# Patient Record
Sex: Female | Born: 1975 | Race: Black or African American | Hispanic: No | Marital: Single | Smoking: Never smoker
Health system: Southern US, Community
[De-identification: ages and names within clinical notes are randomized; demographics above are authoritative.]

## PROBLEM LIST (undated history)

## (undated) DIAGNOSIS — E7800 Pure hypercholesterolemia, unspecified: Secondary | ICD-10-CM

## (undated) DIAGNOSIS — I10 Essential (primary) hypertension: Secondary | ICD-10-CM

## (undated) DIAGNOSIS — E119 Type 2 diabetes mellitus without complications: Secondary | ICD-10-CM

## (undated) DIAGNOSIS — G629 Polyneuropathy, unspecified: Secondary | ICD-10-CM

## (undated) HISTORY — PX: CHOLECYSTECTOMY: SHX55

## (undated) HISTORY — PX: ABDOMINAL HYSTERECTOMY: SHX81

---

## 2014-11-21 ENCOUNTER — Emergency Department (HOSPITAL_BASED_OUTPATIENT_CLINIC_OR_DEPARTMENT_OTHER)
Admission: EM | Admit: 2014-11-21 | Discharge: 2014-11-21 | Disposition: A | Discharge status: Home / Self Care | Payer: Medicaid Other | Attending: Emergency Medicine | Admitting: Emergency Medicine

## 2014-11-21 ENCOUNTER — Encounter (HOSPITAL_BASED_OUTPATIENT_CLINIC_OR_DEPARTMENT_OTHER): Payer: Self-pay

## 2014-11-21 DIAGNOSIS — M436 Torticollis: Secondary | ICD-10-CM | POA: Insufficient documentation

## 2014-11-21 DIAGNOSIS — R739 Hyperglycemia, unspecified: Secondary | ICD-10-CM

## 2014-11-21 DIAGNOSIS — T39311A Poisoning by propionic acid derivatives, accidental (unintentional), initial encounter: Secondary | ICD-10-CM | POA: Insufficient documentation

## 2014-11-21 DIAGNOSIS — M542 Cervicalgia: Secondary | ICD-10-CM | POA: Diagnosis present

## 2014-11-21 DIAGNOSIS — E1165 Type 2 diabetes mellitus with hyperglycemia: Secondary | ICD-10-CM | POA: Insufficient documentation

## 2014-11-21 DIAGNOSIS — Z9114 Patient's other noncompliance with medication regimen: Secondary | ICD-10-CM | POA: Insufficient documentation

## 2014-11-21 DIAGNOSIS — I10 Essential (primary) hypertension: Secondary | ICD-10-CM | POA: Insufficient documentation

## 2014-11-21 DIAGNOSIS — Z8669 Personal history of other diseases of the nervous system and sense organs: Secondary | ICD-10-CM | POA: Insufficient documentation

## 2014-11-21 DIAGNOSIS — M25512 Pain in left shoulder: Secondary | ICD-10-CM | POA: Diagnosis not present

## 2014-11-21 HISTORY — DX: Essential (primary) hypertension: I10

## 2014-11-21 HISTORY — DX: Pure hypercholesterolemia, unspecified: E78.00

## 2014-11-21 HISTORY — DX: Polyneuropathy, unspecified: G62.9

## 2014-11-21 HISTORY — DX: Type 2 diabetes mellitus without complications: E11.9

## 2014-11-21 LAB — CBC WITH DIFFERENTIAL/PLATELET
Basophils Absolute: 0 10*3/uL (ref 0.0–0.1)
Basophils Relative: 0 % (ref 0–1)
Eosinophils Absolute: 0.1 10*3/uL (ref 0.0–0.7)
Eosinophils Relative: 2 % (ref 0–5)
HEMATOCRIT: 41.5 % (ref 36.0–46.0)
Hemoglobin: 14 g/dL (ref 12.0–15.0)
Lymphocytes Relative: 40 % (ref 12–46)
Lymphs Abs: 3.8 10*3/uL (ref 0.7–4.0)
MCH: 27.9 pg (ref 26.0–34.0)
MCHC: 33.7 g/dL (ref 30.0–36.0)
MCV: 82.7 fL (ref 78.0–100.0)
MONO ABS: 0.5 10*3/uL (ref 0.1–1.0)
MONOS PCT: 5 % (ref 3–12)
NEUTROS ABS: 4.9 10*3/uL (ref 1.7–7.7)
NEUTROS PCT: 53 % (ref 43–77)
Platelets: 352 10*3/uL (ref 150–400)
RBC: 5.02 MIL/uL (ref 3.87–5.11)
RDW: 12.1 % (ref 11.5–15.5)
WBC: 9.4 10*3/uL (ref 4.0–10.5)

## 2014-11-21 LAB — BASIC METABOLIC PANEL
Anion gap: 9 (ref 5–15)
BUN: 6 mg/dL (ref 6–20)
CALCIUM: 9.3 mg/dL (ref 8.9–10.3)
CO2: 25 mmol/L (ref 22–32)
CREATININE: 0.53 mg/dL (ref 0.44–1.00)
Chloride: 100 mmol/L — ABNORMAL LOW (ref 101–111)
Glucose, Bld: 385 mg/dL — ABNORMAL HIGH (ref 65–99)
Potassium: 3.6 mmol/L (ref 3.5–5.1)
Sodium: 134 mmol/L — ABNORMAL LOW (ref 135–145)

## 2014-11-21 LAB — CBG MONITORING, ED: Glucose-Capillary: 290 mg/dL — ABNORMAL HIGH (ref 65–99)

## 2014-11-21 MED ORDER — METHOCARBAMOL 500 MG PO TABS: 1000.0000 mg | ORAL_TABLET | Freq: Four times a day (QID) | ORAL | Status: AC | PRN

## 2014-11-21 MED ORDER — SODIUM CHLORIDE 0.9 % IV BOLUS (SEPSIS)
1000.0000 mL | Freq: Once | INTRAVENOUS | Status: AC
  Administered 2014-11-21: 1000 mL via INTRAVENOUS

## 2014-11-21 MED ORDER — METFORMIN HCL 500 MG PO TABS: 500.0000 mg | ORAL_TABLET | Freq: Two times a day (BID) | ORAL | Status: AC

## 2014-11-21 NOTE — Discharge Instructions (Signed)
Do not hesitate to return to the emergency room for any new, worsening or concerning symptoms.  Please obtain primary care using resource guide below. Let them know that you were seen in the emergency room and that they will need to obtain records for further outpatient management.   Torticollis, Acute You have suddenly (acutely) developed a twisted neck (torticollis). This is usually a self-limited condition. CAUSES  Acute torticollis may be caused by malposition, trauma or infection. Most commonly, acute torticollis is caused by sleeping in an awkward position. Torticollis may also be caused by the flexion, extension or twisting of the neck muscles beyond their normal position. Sometimes, the exact cause may not be known. SYMPTOMS  Usually, there is pain and limited movement of the neck. Your neck may twist to one side. DIAGNOSIS  The diagnosis is often made by physical examination. X-rays, CT scans or MRIs may be done if there is a history of trauma or concern of infection. TREATMENT  For a common, stiff neck that develops during sleep, treatment is focused on relaxing the contracted neck muscle. Medications (including shots) may be used to treat the problem. Most cases resolve in several days. Torticollis usually responds to conservative physical therapy. If left untreated, the shortened and spastic neck muscle can cause deformities in the face and neck. Rarely, surgery is required. HOME CARE INSTRUCTIONS   Use over-the-counter and prescription medications as directed by your caregiver.  Do stretching exercises and massage the neck as directed by your caregiver.  Follow up with physical therapy if needed and as directed by your caregiver. SEEK IMMEDIATE MEDICAL CARE IF:   You develop difficulty breathing or noisy breathing (stridor).  You drool, develop trouble swallowing or have pain with swallowing.  You develop numbness or weakness in the hands or feet.  You have changes in  speech or vision.  You have problems with urination or bowel movements.  You have difficulty walking.  You have a fever.  You have increased pain. MAKE SURE YOU:   Understand these instructions.  Will watch your condition.  Will get help right away if you are not doing well or get worse. Document Released: 03/28/2000 Document Revised: 06/23/2011 Document Reviewed: 05/09/2009 [facility] Patient Information 2015 [place], [place]. This information is not intended to replace advice given to you by your health care provider. Make sure you discuss any questions you have with your health care provider.   Emergency Department Resource Guide 1) Find a Doctor and Pay Out of Pocket Although you won't have to find out who is covered by your insurance plan, it is a good idea to ask around and get recommendations. You will then need to call the office and see if the doctor you have chosen will accept you as a new patient and what types of options they offer for patients who are self-pay. Some doctors offer discounts or will set up payment plans for their patients who do not have insurance, but you will need to ask so you aren't surprised when you get to your appointment.  2) Contact Your Local Health Department Not all health departments have doctors that can see patients for sick visits, but many do, so it is worth a call to see if yours does. If you don't know where your local health department is, you can check in your phone book. The CDC also has a tool to help you locate your state's health department, and many state websites also have listings of all of their local  health departments.  3) Find a Walk-in Clinic If your illness is not likely to be very severe or complicated, you may want to try a walk in clinic. These are popping up all over the country in pharmacies, drugstores, and shopping centers. They're usually staffed by nurse practitioners or physician assistants that have been trained to  treat common illnesses and complaints. They're usually fairly quick and inexpensive. However, if you have serious medical issues or chronic medical problems, these are probably not your best option.  No Primary Care Doctor: - Call Health Connect at  [phone] - they can help you locate a primary care doctor that  accepts your insurance, provides certain services, etc. - Physician Referral Service- [phone]  Chronic Pain Problems: Organization         Address  Phone   Notes  [facility]  [phone] Patients need to be referred by their primary care doctor.   Medication Assistance: Organization         Address  Phone   Notes  [facility] Medication [facility] [address] [place], [place] [address] [phone] --Must be a resident of [facility] -- Must have NO insurance coverage whatsoever (no Medicaid/ Medicare, etc.) -- The pt. MUST have a primary care doctor that directs their care regularly and follows them in the community   [facility]  [phone]   [employer]  [phone]    Agencies that provide inexpensive medical care: Organization         Address  Phone   Notes  [facility]  [phone]   [facility]    [phone]   [facility] [address] [place], [place] [address] [phone]   [facility] [address] [place] [phone]   Planned Parenthood    [phone]   [facility]    [phone]   [facility]  [address], [place] Phone:  [phone], Fax:  [phone] Hours of Operation:  9 am - 6 pm, M-F.  Also accepts Medicaid/Medicare and self-pay.  [facility]  [address], [place] Phone: [phone], Fax: [phone]. Hours of Operation:  8:30 am - 5:30 pm, M-F.  Also accepts Medicaid and self-pay.  [facility]  [place] [address], [place] Phone: [phone]   [facility] [address] [place], [place] [phone], Ext. [phone] Mondays & Thursdays: 7-9 AM.  First 15 patients are seen on a first come, first serve basis.    Medicaid-accepting [facility] Providers:  Organization         Address  Phone   Notes  [facility] [address],  [phone] Also accepts self-pay patients.  [facility] [address] Josephs [place], [place]  [phone]   [facility] [address], [place] [phone]   [facility] Family Medicine [address], [place] [phone]   Renaye Rakers [address], [place]   [phone] Only accepts [place] Access [place] patients after they have their name applied to their card.   Self-Pay (no insurance) in [facility]:  Organization         Address  Phone   Notes  Sickle Cell Patients, [employer]  Internal Medicine [address] [place], [place] [phone]   [facility] [address] [place], [place] [phone]   [facility]  [address], [place] [phone]   [facility]/Dr. Osei-Bonsu  [address], [place] or [address], [place] [phone] Phone number for both [place] and [place] locations is the same.  [facility]gent Medical and [facility] [address] [place] [phone]   [facility] [address], [place] or [address] [phone]   [facility] [address], [place] [phone], phone; [phone], fax Sees patients 1st and 3rd Saturday of every month.  Must not qualify for public or private insurance (i.e. Medicaid, Medicare, [place] Health Choice, Veterans' Benefits)  Household income should be no more than 200%  of the poverty level The clinic cannot treat you if you are pregnant or think you are pregnant  Sexually transmitted diseases are not treated at the clinic.    Dental Care: Organization         Address  Phone  Notes  [facility] [address] [place], [place] [phone] Accepts children up to age 65 who are enrolled in [place] or [place] Health Choice; pregnant women with a Medicaid card; and children who have applied for Medicaid or [place] Health Choice, but were declined, whose parents can pay a reduced fee at time of service.  [facility] Department of [facility]  [address], [place] [phone] Accepts children up to age 64 who are enrolled in [place] or [place] Health Choice; pregnant women with a Medicaid card; and children who have applied for Medicaid or [place] Health Choice, but were declined, whose parents can pay a reduced fee at time of service.  [facility]  [address] [place], [place] [phone] Patients are seen by appointment only. Walk-ins are not accepted. [facility] will see patients 18 years of age and older. Monday - Tuesday (8am-5pm) Most Wednesdays (8:30-5pm) $30 per visit, cash only  [facility] Adult Dental Access [facility]  [address], [facility] [phone] Patients are seen by appointment only. Walk-ins are not accepted. [facility] will see patients 11 years of age and older. One Wednesday Evening (Monthly: Volunteer Based).  $30 per visit, cash only  [employer]  [phone] for adults; Children under age 22, call Graduate Pediatric Dentistry at [phone]. Children aged 32-14, please call [phone] to request a pediatric application.  Dental services are provided in all areas of dental care including fillings, crowns and bridges, complete and partial dentures, implants, gum treatment, root canals, and  extractions. Preventive care is also provided. Treatment is provided to both adults and children. Patients are selected via a lottery and there is often a waiting list.   [facility] [address], [place]  [phone] [email or web address]   [facility] [address] [place], [place] [phone], Ext. [phone] Second and Fourth Thursday of each month, opens at 6:30 AM; Clinic ends at 9 AM.  Patients are seen on a first-come first-served basis, and a limited number are seen during each clinic.   [facility]  [address] [place], [place] [phone]   Eligibility Requirements You must have  lived in [place], [place], or [place] counties for at least the last three months.   You cannot be eligible for state or federal sponsored [place], including [employer], [place], or [employer].   You generally cannot be eligible for healthcare insurance through your employer.    How to apply: Eligibility screenings are held every Tuesday and Wednesday afternoon from 1:00 pm until 4:00 pm. You do not need an appointment for the interview!  [facility] [address] [place], [place] [phone]   [facility] Health Department  [phone]   [facility] Health Department  [phone]   [facility] Health Department  [phone]    Behavioral Health Resources in the Community: Intensive Outpatient Programs Organization         Address  Phone  Notes  [facility] Services [address] [place], [place] [phone]   [facility] Outpatient [address], [place], [place] [phone]   [facility] [address] [place], [place]  [phone]   [facility] Mental Health [address],  [place], [place] [phone] or [phone]   Substance Abuse Resources Organization         Address  Phone  Notes  Alcohol and Drug Services  [phone]     [facility]  [phone]   [facility] [place]  [phone]   Floydene Flock  [phone]   Residential & Outpatient Substance Abuse Program  [phone]   Psychological Services Organization         Address  Phone  Notes  [facility] Behavioral Health  [phone]   [facility]  [phone]   [facility] Mental Health [address], [place] [phone] or [phone]    Mobile Crisis Teams Organization         Address  Phone  Notes  [facility], Mobile Crisis Care Unit  [phone]   [facility]  [address] [place], [place] [phone]   Doristine Locks [address] [place] [phone]    Self-Help/Support Groups Organization         Address  Phone             Notes  [facility] [place] - variety of support groups  336- I7437963 Call for more information  Narcotics Anonymous (NA), [facility] [address], [employer] [place]  2 meetings at this location   Statistician         Address  Phone  Notes  [facility] [address],    [place]  [phone]   [facility]  [address] [place] [address], [place], [place] [phone]   [facility] [address] [place], [place] [phone] Admissions: 8am-3pm M-F  [facility] [address],    [place], [place] [phone]   [facility] [address] [place], [place], [place] [phone]   [facility] [address]  [place], [place] [phone]   [facility] - Intensive Outpatient [address]ure[address], [place], [place] [phone]   [facility] ([facility] [address] [place]  [place], [place] [phone] or [phone]   [facility] ([facility]) [address] [place], [place] [phone] Accepts Medicaid  [facility] [address],   [place] [phone] Substance Abuse/Addiction Treatment   [facility] Resources Organization         Address  Phone  Notes  [facility]  [phone]   Domenic Schwab, PhD [address] Arlis Port[place], [place]   [phone] or [phone]   [facility] [place], [place] [phone]   [address] [place], [place] [phone] Insurance/Medicaid/sponsorship through [facility] [address] [place]                                    [place], [place] [phone] [place] [address], [place] [phone]    Dr. Adele Schilder  [phone]   [facility] Dept. 1) [address] [place] [address] [place] 2) [place] 3)  [place] [address], [place] [phone]  [phone]   [place] [phone] or [phone] (After Hours)

## 2014-11-21 NOTE — ED Provider Notes (Signed)
CSN: [id number]     Arrival date & time 11/21/14  1725 History   First MD Initiated Contact with Patient 11/21/14 1739     Chief Complaint  Patient presents with  . Neck Pain     (Consider location/radiation/quality/duration/timing/severity/associated sxs/prior Treatment) HPI   Blood pressure 137/76, pulse 94, temperature 98.3 F (36.8 C), temperature source Oral, resp. rate 18, height  (1.626 m), weight 198 lb (89.812 kg), SpO2 100 %.  Danette Weinfeld is a 39 y.o. female complaining of atraumatic left neck and shoulder pain onset 3 days ago. Patient states she's been taking for 200 mg ibuprofen every 4 hours since the onset of the pain 3 days ago. She denies nausea, vomiting, change in bowel or bladder habits, chest pain, shortness of breath, headache, syncope.  Past Medical History  Diagnosis Date  . Diabetes mellitus without complication   . Hypertension   . High cholesterol   . Neuropathy    Past Surgical History  Procedure Laterality Date  . Abdominal hysterectomy    . Cholecystectomy     No family history on file. History  Substance Use Topics  . Smoking status: Never Smoker   . Smokeless tobacco: Not on file  . Alcohol Use: No   OB History    No data available     Review of Systems    Allergies  [place]  Home Medications   Prior to Admission medications   Not on File   BP 137/76 mmHg  Pulse 94  Temp(Src) 98.3 F (36.8 C) (Oral)  Resp 18  Ht  (1.626 m)  Wt 198 lb (89.812 kg)  BMI 33.97 kg/m2  SpO2 100% Physical Exam  Constitutional: She is oriented to person, place, and time. She appears well-developed and well-nourished. No distress.  HENT:  Head: Normocephalic.  Eyes: Conjunctivae and EOM are normal.  Neck:  No midline tenderness to palpation. Patient has a left-sided trapezius spasm with tenderness palpation, she has reduced range of motion and right sided flexion.  Cardiovascular: Normal rate, regular rhythm and intact distal  pulses.   Pulmonary/Chest: Effort normal and breath sounds normal. No stridor. No respiratory distress. She has no wheezes. She exhibits no tenderness.  Abdominal: Soft. There is no tenderness.  Musculoskeletal: Normal range of motion. She exhibits no tenderness.  Neurological: She is alert and oriented to person, place, and time.  Psychiatric: She has a normal mood and affect.  Nursing note and vitals reviewed.   ED Course  Procedures (including critical care time) Labs Review Labs Reviewed - No data to display  Imaging Review No results found.   EKG Interpretation None      MDM   Final diagnoses:  Torticollis, acute  Accidental ibuprofen overdose, initial encounter  Hyperglycemia without ketosis  Noncompliance with medication regimen    Filed Vitals:   11/21/14 1732  BP: 137/76  Pulse: 94  Temp: 98.3 F (36.8 C)  TempSrc: Oral  Resp: 18  Height:  (1.626 m)  Weight: 198 lb (89.812 kg)  SpO2: 100%    Medications  sodium chloride 0.9 % bolus 1,000 mL (1,000 mLs Intravenous New Bag/Given 11/21/14 1831)    Dniyah Grant is a pleasant 39 y.o. female presenting with sided trapezius spasm with associated toward a Colace. Patient has exceeded the daily dose of ibuprofen.   Patient has normal renal function however her glucose is 385 she has a normal anion gap. On further discussion with this patient's I see that she is  noncompliant with her insulin and metformin. States that she recently lost her Medicaid but just had it reinstated, she has not had a chance to establish primary care because her daughter is sick with new onset seizures. We've had an extensive discussion on the importance of primary care follow-up and height glucose control. Patient will be restarted on her metformin given her a resource guide.  Evaluation does not show pathology that would require ongoing emergent intervention or inpatient treatment. Pt is hemodynamically stable and mentating  appropriately. Discussed findings and plan with patient/guardian, who agrees with care plan. All questions answered. Return precautions discussed and outpatient follow up given.   New Prescriptions   METFORMIN (GLUCOPHAGE) 500 MG TABLET    Take 1 tablet (500 mg total) by mouth 2 (two) times daily with a meal.   METHOCARBAMOL (ROBAXIN) 500 MG TABLET    Take 2 tablets (1,000 mg total) by mouth 4 (four) times daily as needed (Pain).         Wynetta Emery, PA-C 11/21/14 1912  Richardean Canal, MD 11/21/14 2351

## 2014-11-21 NOTE — ED Notes (Signed)
Reports right side neck pain since Saturday. Denies injury or trauma.

## 2014-12-01 ENCOUNTER — Encounter (HOSPITAL_BASED_OUTPATIENT_CLINIC_OR_DEPARTMENT_OTHER): Payer: Self-pay

## 2014-12-01 ENCOUNTER — Emergency Department (HOSPITAL_BASED_OUTPATIENT_CLINIC_OR_DEPARTMENT_OTHER): Payer: Medicaid Other

## 2014-12-01 ENCOUNTER — Emergency Department (HOSPITAL_BASED_OUTPATIENT_CLINIC_OR_DEPARTMENT_OTHER)
Admission: EM | Admit: 2014-12-01 | Discharge: 2014-12-01 | Disposition: A | Discharge status: Home / Self Care | Payer: Medicaid Other | Attending: Emergency Medicine | Admitting: Emergency Medicine

## 2014-12-01 DIAGNOSIS — I10 Essential (primary) hypertension: Secondary | ICD-10-CM | POA: Diagnosis not present

## 2014-12-01 DIAGNOSIS — R200 Anesthesia of skin: Secondary | ICD-10-CM | POA: Insufficient documentation

## 2014-12-01 DIAGNOSIS — Z8669 Personal history of other diseases of the nervous system and sense organs: Secondary | ICD-10-CM | POA: Diagnosis not present

## 2014-12-01 DIAGNOSIS — Z79899 Other long term (current) drug therapy: Secondary | ICD-10-CM | POA: Insufficient documentation

## 2014-12-01 DIAGNOSIS — E1165 Type 2 diabetes mellitus with hyperglycemia: Secondary | ICD-10-CM | POA: Insufficient documentation

## 2014-12-01 DIAGNOSIS — R739 Hyperglycemia, unspecified: Secondary | ICD-10-CM

## 2014-12-01 DIAGNOSIS — R202 Paresthesia of skin: Secondary | ICD-10-CM | POA: Diagnosis not present

## 2014-12-01 DIAGNOSIS — M79672 Pain in left foot: Secondary | ICD-10-CM

## 2014-12-01 LAB — CBG MONITORING, ED: Glucose-Capillary: 246 mg/dL — ABNORMAL HIGH (ref 65–99)

## 2014-12-01 MED ORDER — NAPROXEN 500 MG PO TABS: 500.0000 mg | ORAL_TABLET | Freq: Two times a day (BID) | ORAL | Status: AC

## 2014-12-01 NOTE — ED Provider Notes (Signed)
CSN: [id number]     Arrival date & time 12/01/14  1553 History   First MD Initiated Contact with Patient 12/01/14 1618     Chief Complaint  Patient presents with  . Foot Pain     (Consider location/radiation/quality/duration/timing/severity/associated sxs/prior Treatment) Patient is a 39 y.o. female presenting with lower extremity pain. The history is provided by the patient and medical records.  Foot Pain Associated symptoms include arthralgias.    This is a 39 year old female with history of hypertension, hyperlipidemia, diabetes, peripheral neuropathy, presenting to the ED for left foot pain. Patient states is been ongoing for the past month. She denies any known injury, trauma, or falls. Pain is only localized to the heel and does not radiate. States she has chronic numbness and paresthesias of her left foot which is unchanged. She denies any difficulty walking, although it is painful. Patient denies any new wounds or ulcers to her feet. No fever or chills. Of note, patient has not currently been taking any of her diabetes medications recently.  Patient states she is always thirsty and can drink a whole case of water at once.  States she has been taking Motrin daily to help with pain.  Past Medical History  Diagnosis Date  . Diabetes mellitus without complication   . Hypertension   . High cholesterol   . Neuropathy    Past Surgical History  Procedure Laterality Date  . Abdominal hysterectomy    . Cholecystectomy     No family history on file. Social History  Substance Use Topics  . Smoking status: Never Smoker   . Smokeless tobacco: None  . Alcohol Use: No   OB History    No data available     Review of Systems  Musculoskeletal: Positive for arthralgias.  All other systems reviewed and are negative.     Allergies  Soma  Home Medications   Prior to Admission medications   Medication Sig Start Date End Date Taking? Authorizing Provider  metFORMIN (GLUCOPHAGE)  500 MG tablet Take 1 tablet (500 mg total) by mouth 2 (two) times daily with a meal. 11/21/14   Joni Reining Pisciotta, PA-C  methocarbamol (ROBAXIN) 500 MG tablet Take 2 tablets (1,000 mg total) by mouth 4 (four) times daily as needed (Pain). 11/21/14   Nicole Pisciotta, PA-C   BP 138/93 mmHg  Pulse 95  Temp(Src) 98.3 F (36.8 C) (Oral)  Resp 16  Ht 5\' 4"  (1.626 m)  Wt 190 lb (86.183 kg)  BMI 32.60 kg/m2  SpO2 100%   Physical Exam  Constitutional: She is oriented to person, place, and time. She appears well-developed and well-nourished. No distress.  HENT:  Head: Normocephalic and atraumatic.  Mouth/Throat: Oropharynx is clear and moist.  Eyes: Conjunctivae and EOM are normal. Pupils are equal, round, and reactive to light.  Neck: Normal range of motion. Neck supple.  Cardiovascular: Normal rate, regular rhythm and normal heart sounds.   Pulmonary/Chest: Effort normal and breath sounds normal. No respiratory distress. She has no wheezes.  Abdominal: Soft. Bowel sounds are normal. There is no tenderness. There is no guarding.  Musculoskeletal: Normal range of motion. She exhibits no edema.  Left foot normal in appearance without open wounds or ulcers present; no focal tenderness noted, no swelling or bony deformities, foot is neurovascularly intact  Neurological: She is alert and oriented to person, place, and time.  Skin: Skin is warm and dry. She is not diaphoretic.  Psychiatric: She has a normal mood and affect.  Nursing  note and vitals reviewed.   ED Course  Procedures (including critical care time) Labs Review Labs Reviewed  CBG MONITORING, ED - Abnormal; Notable for the following:    Glucose-Capillary 246 (*)    All other components within normal limits    Imaging Review Dg Foot Complete Left  12/01/2014   CLINICAL DATA:  Left heel pain  EXAM: LEFT FOOT - COMPLETE 3+ VIEW  COMPARISON:  None.  FINDINGS: Three views of the left foot submitted. No acute fracture or subluxation. No  radiopaque foreign body.  IMPRESSION: Negative.   Electronically Signed   By: Florita Mead M.D.   On: 12/01/2014 17:00      EKG Interpretation None      MDM   Final diagnoses:  Foot pain, left  Hyperglycemia   39 year old female who with left foot pain for the past month. No known injury, trauma, or pulse. No visible wounds or ulcers on left foot. Patient is diabetic and has baseline peripheral neuropathy without acute changes. Foot is neurovascularly intact. X-rays negative for acute bony findings. CBG was checked here, 246. Patient had blood work done in the ED approximately 10 days ago, normal renal function at that time. Patient has been taking a large amount of Motrin, states she sometimes gets confused and takes too many pills because she is trying to make "  tablets".  Will start naprosyn so there will be no confusion regarding dosing.  Patient was encouraged to continue monitoring her blood sugar routinely at home, continue metformin. She states she is unhappy with her former primary care physician and is looking for a new one. I have given her a resource guide to help with this.  Discussed plan with patient, he/she acknowledged understanding and agreed with plan of care.  Return precautions given for new or worsening symptoms.  Garlon Hatchet, PA-C 12/01/14 1808  Rolland Porter, MD 12/08/14 [phone]

## 2014-12-01 NOTE — Discharge Instructions (Signed)
Take the prescribed medication as directed. Follow-up with a primary care physician in the area. Resource guide attached to help you choose one. Return to the ED for new or worsening symptoms.   Emergency Department Resource Guide 1) Find a Doctor and Pay Out of Pocket Although you won't have to find out who is covered by your insurance plan, it is a good idea to ask around and get recommendations. You will then need to call the office and see if the doctor you have chosen will accept you as a new patient and what types of options they offer for patients who are self-pay. Some doctors offer discounts or will set up payment plans for their patients who do not have insurance, but you will need to ask so you aren't surprised when you get to your appointment.  2) Contact Your Local Health Department Not all health departments have doctors that can see patients for sick visits, but many do, so it is worth a call to see if yours does. If you don't know where your local health department is, you can check in your phone book. The CDC also has a tool to help you locate your state's health department, and many state websites also have listings of all of their local health departments.  3) Find a Walk-in Clinic If your illness is not likely to be very severe or complicated, you may want to try a walk in clinic. These are popping up all over the country in pharmacies, drugstores, and shopping centers. They're usually staffed by nurse practitioners or physician assistants that have been trained to treat common illnesses and complaints. They're usually fairly quick and inexpensive. However, if you have serious medical issues or chronic medical problems, these are probably not your best option.  No Primary Care Doctor: - Call Health Connect at  [phone] - they can help you locate a primary care doctor that  accepts your insurance, provides certain services, etc. - Physician Referral Service-  [phone]  Chronic Pain Problems: Organization         Address  Phone   Notes  [facility]  [phone] Patients need to be referred by their primary care doctor.   Medication Assistance: Organization         Address  Phone   Notes  [facility] Medication [facility] [address] [place] [place], [place] [address] [phone] --Must be a resident of [facility] -- Must have NO insurance coverage whatsoever (no Medicaid/ Medicare, etc.) -- The pt. MUST have a primary care doctor that directs their care regularly and follows them in the community   Med[facility]ist  [phone]   [employer]  [phone]    Agencies that provide inexpensive medical care: Organization         Address  Phone   Notes  [facility]  [phone]   [facility]    [phone]   [facility] [address] [place], [place] [address] [phone]   [facility] [address] [place] [place] [phone]   Planned Parenthood    [phone]   [facility]    [phone]   [facility]  [address], [place] Phone:  [phone], Fax:  [phone] Hours of Operation:  9 am - 6 pm, M-F.  Also accepts Medicaid/Medicare and self-pay.  [facility] for Children  [place], [address], [place] Phone: [phone], Fax: [phone]. Hours of Operation:  8:30 am - 5:30 pm, M-F.  Also accepts Medicaid and self-pay.  [facility] [address], [place] Phone: [phone]   [place], [place], [place] [phone], Ext. [phone] Mondays & Thursdays: 7-9 AM.  First 15 patients are seen on a first come, first serve basis.    [place] Providers:  Organization         Address  Phone   Notes  [facility] [address],  [place] [phone] Also accepts self-pay patients.  [facility] [address], [place]  [phone]   [place], [address], [place] [phone]   [facility] [address] [place] [phone]   Lucianne Lei [address], [place]   [phone] Only accepts [place] Access [place] patients after they have their name applied to their card.   Self-Pay (no insurance) in [facility]:  Organization         Address  Phone   Notes  Sickle Cell Patients, [facility] Internal Medicine [place] [phone]   [facility] [place] [phone]   [facility] [place]  [place], [place], [place] [phone]   [facility]/Dr. Osei-Bonsu  [address], [place] or [address], [place] [phone] Phone number for both [place] and [place] locations is the same.  [facility]gent [facility] [address], [place] [phone]   [facility] [address] [place] or [address] [phone]   [facility] [address] [place] [phone], phone; [phone], fax Sees patients 1st and 3rd Saturday of every month.  Must not qualify for public or private insurance (i.e. Medicaid, Medicare, [place] Health Choice, Veterans' Benefits)  Household income should be no more than 200% of the poverty level The clinic cannot treat you if you are pregnant or think you are pregnant  Sexually transmitted diseases are not treated at the clinic.    Dental Care: Organization         Address  Phone  Notes  [facility] Department of [facility] [place] [phone] Accepts children up to age 50 who are enrolled in  [place] or [place]; pregnant women with a Medicaid card; and children who have applied for Medicaid or [place] Health Choice, but were declined, whose parents can pay a reduced fee at time of service.  [facility] Department of [facility]  [address], [place] [phone] Accepts children up to age 26 who are enrolled in [place] or [place]; pregnant women with a Medicaid card; and children who have applied for Medicaid or [place] Health Choice, but were declined, whose parents can pay a reduced fee at time of service.  [place] Adult [facility] Access PROGRAM  [place] [phone] Patients are seen by appointment only. Walk-ins are not accepted. [place] will see patients 70 years of age and older. Monday - Tuesday (8am-5pm) Most Wednesdays (8:30-5pm) $30 per visit, cash only  [place] Adult  Dental Access PROGRAM  [address], [facility] [phone] Patients are seen by appointment only. Walk-ins are not accepted. [place] will see patients 42 years of age and older. One Wednesday Evening (Monthly: Volunteer Based).  $30 per visit, cash only  [place]  [phone] for adults; Children under age 54, call Graduate Pediatric Dentistry at [phone]. Children aged 65-14, please call [phone] to request a pediatric application.  Dental services are provided in all areas of dental care including fillings, crowns and bridges, complete and partial dentures, implants, gum treatment, root canals, and extractions. Preventive care is also provided. Treatment is provided to both adults and children. Patients are selected via a lottery and there is often a waiting list.   [facility] [address] [place]  [phone] [email or web address]   [facility] [address] [place], [place] [phone], Ext. [phone] Second and Fourth Thursday of each month, opens at 6:30  AM; Clinic ends at 9 AM.  Patients are seen on a first-come first-served basis, and a limited number are seen during each clinic.   [facility]  [address] Hillard Danker [place], [place] [phone]   Eligibility Requirements You must have lived in [place], [place], or [place] counties for at least the last three months.   You cannot be eligible for state or federal sponsored [employer], including [employer], [place], or [employer].   You generally cannot be eligible for healthcare insurance through your employer.    How to apply: Eligibility screenings are held every Tuesday and Wednesday afternoon from 1:00 pm until 4:00 pm. You do not need an appointment for the interview!  [facility] [address] [place], [place]   [place]  [place] Department  [place]  [phone]    Behavioral Health Resources in the Community: Intensive Outpatient Programs Organization         Address  Phone  Notes  [place]. [address], [place], [place] [phone]   [facility] Outpatient [address], [place], [place]   ADS: Alcohol & Drug Svcs [address] [place], [place]   [place] [address]  [place], [place] or [phone]   Substance Abuse Resources Organization         Address  Phone  Notes  Alcohol and Drug Services  [phone]   [place]  [phone]   [facility] [place]   Chinita Pester  [phone]   Residential & Outpatient Substance Abuse Program  [phone]   Psychological Services Organization         Address  Phone  Notes  [facility] [place]  [place]  [phone]   [place] [address], [place] [phone] or  [phone]    Mobile Crisis Teams Organization         Address  Phone  Notes  [facility], Mobile Crisis Care Unit  [phone]   [facility]  [address] [place], [place]   [place] [address] [place] [phone]    Self-Help/Support Groups Organization         Address  Phone             Notes  Mental  [facility] [place] - variety of support groups  [place] Call for more information  Narcotics Anonymous (NA), [facility] [address], [employer] [place]  2 meetings at this location   Special educational needs teacher         Address  Phone  Notes  [facility] Residential Treatment [place],    [place]  [phone]   [facility]  [address] [place] [address]270, [place], [place]   [place], [place] [phone] Admissions: 8am-3pm M-F  Incentives Substance [place] [address],    [place], [place] [phone]   [facility] [address] [place], [place], [place]   [facility] [place] [facility] [address]  [place], [place]   [facility] Programs - Intensive Outpatient [address], [place], [place]   [facility] [place].,  [place], [place] [phone] or [phone]   [facility] Services (RTS) [address] [place], [place] Accepts Medicaid  [facility] [place] [address]  [place] [phone] Substance Abuse/Addiction Treatment   [facility] Organization         Address  Phone  Notes  [facility]  [phone]   Domenic Schwab, PhD [address] [place], [place]   [phone] or [phone]   [facility] [place], [place] [phone]   [facility] [address]  [place], [place] [phone] Insurance/Medicaid/sponsorship through [facility] and Families [address] [place] [address]                                    [place], [place] [phone] [place] [address], [place] [phone]    Dr. Adele Schilder  [phone]   [facility] Dept. 1) [address], [place] 2) [place] 3)  [place] [address], [place] [phone]  [phone]   [place] [phone] or [phone] (After Hours)

## 2014-12-01 NOTE — ED Notes (Signed)
Left foot pain x 1 month with no known injury.

## 2016-03-22 IMAGING — DX DG FOOT COMPLETE 3+V*L*
3 series · 3 of 3 positions shown · non-contrast
Comparison: None.

CLINICAL DATA: Left heel pain

EXAM:
LEFT FOOT - COMPLETE 3+ VIEW

[foot ap]
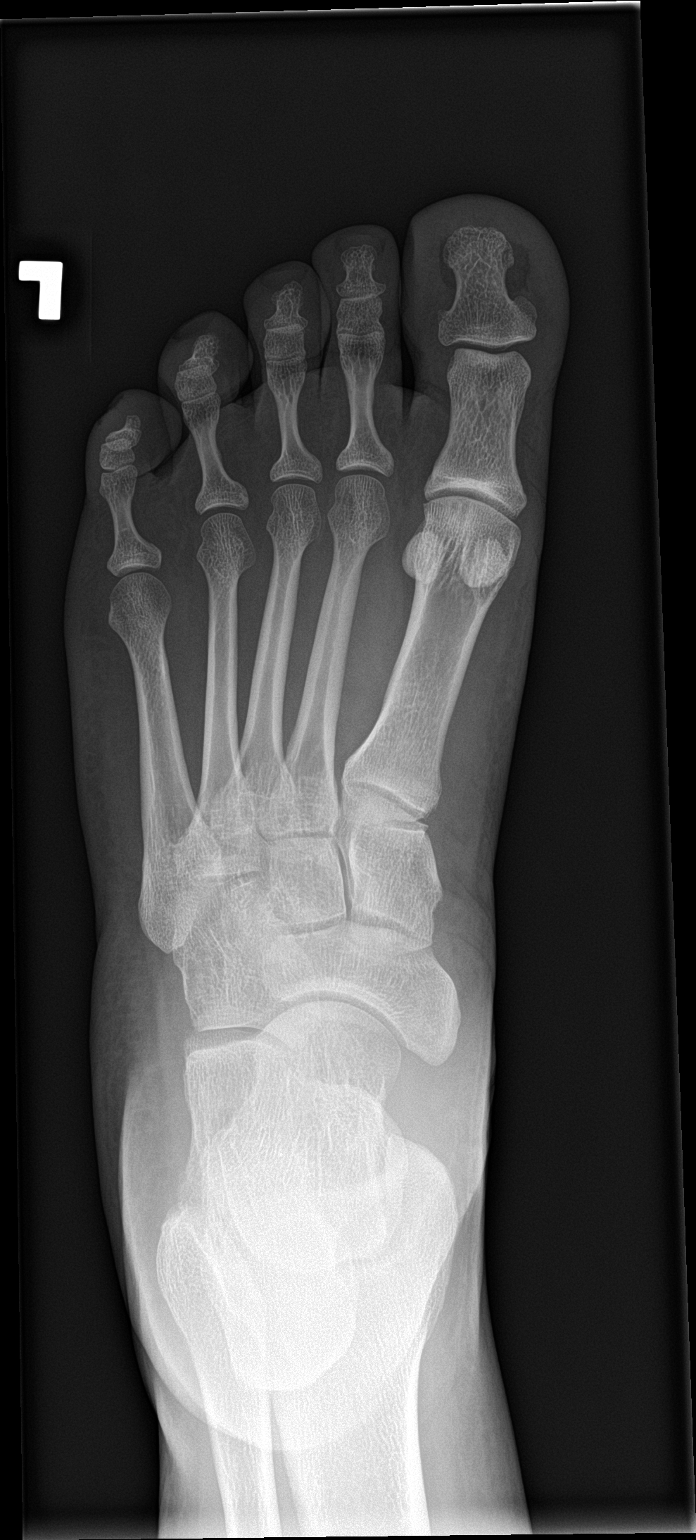

[foot obl]
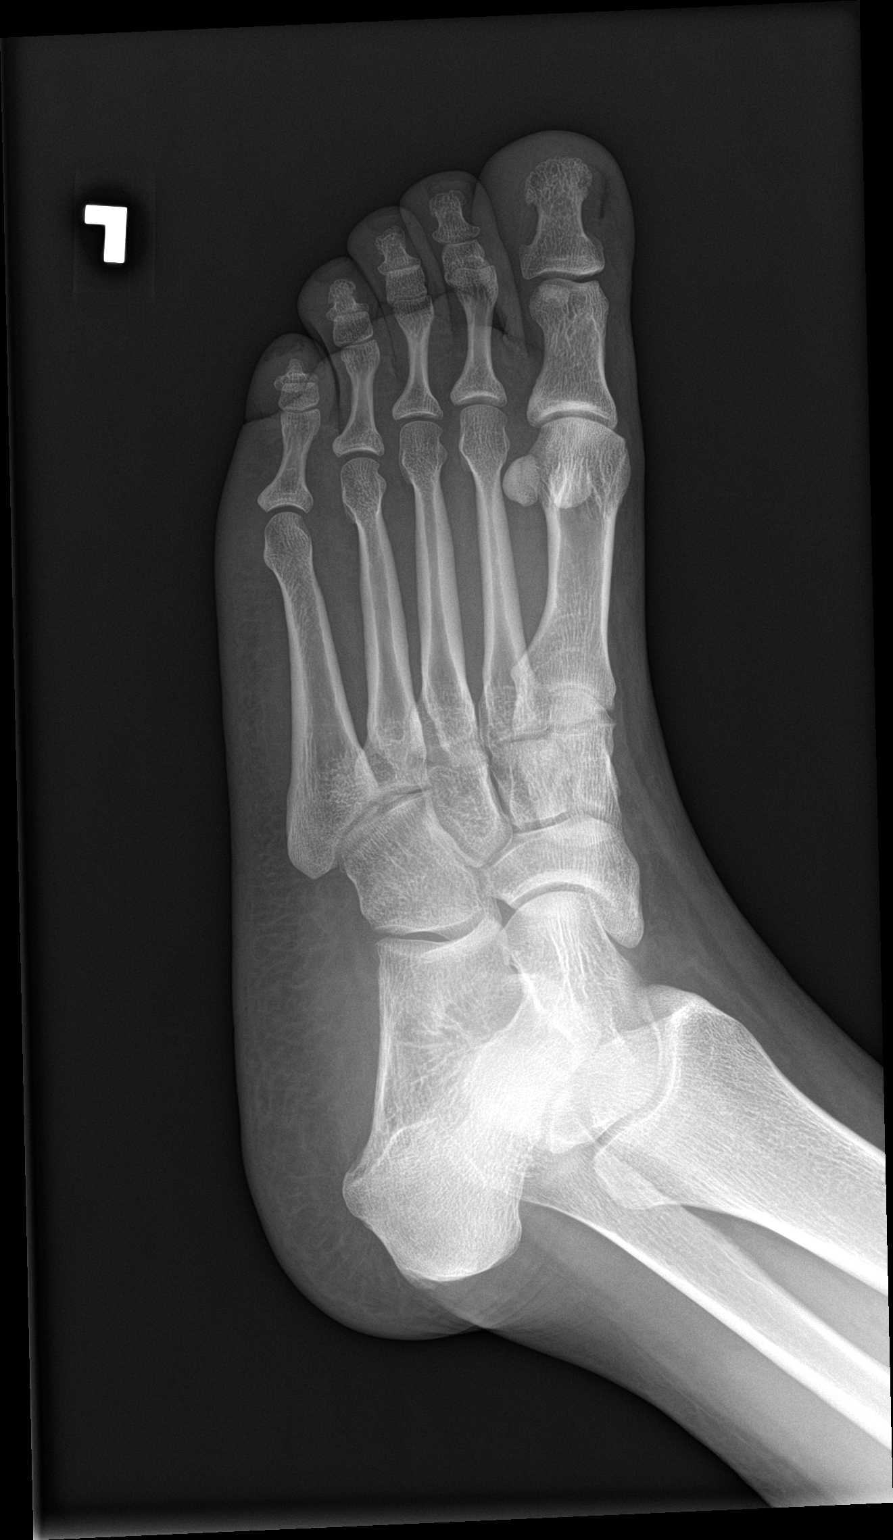

[foot lat]
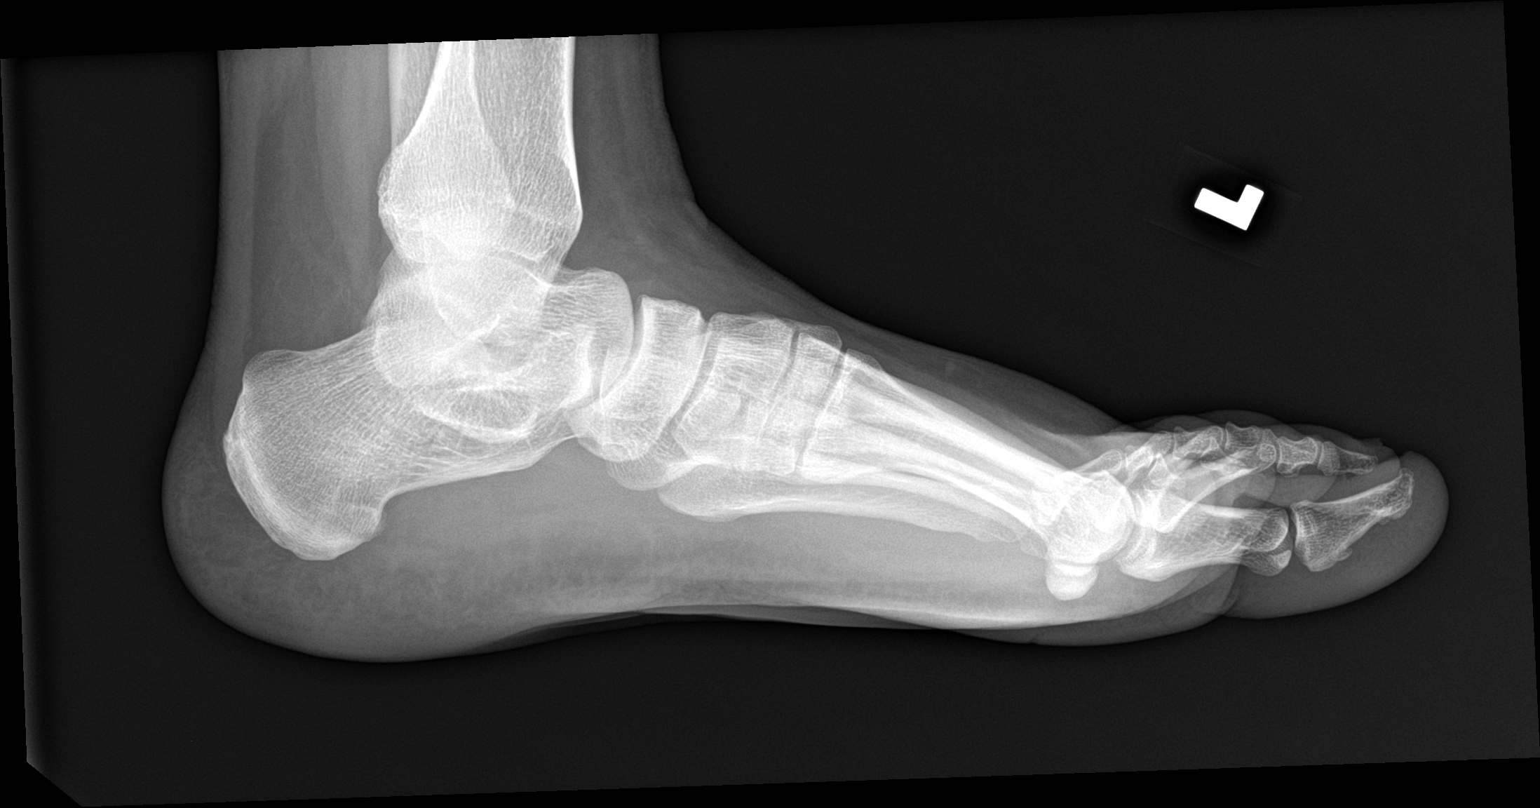

[3 of 3 positions shown; findings below may reference images not displayed]

FINDINGS: Three views of the left foot submitted. No acute fracture or
subluxation. No radiopaque foreign body.
IMPRESSION: Negative.
